# Patient Record
Sex: Female | Born: 1985 | Race: White | Hispanic: No | Marital: Single
Health system: Southern US, Community
[De-identification: ages and names within clinical notes are randomized; demographics above are authoritative.]

---

## 2013-01-13 ENCOUNTER — Other Ambulatory Visit: Payer: Self-pay

## 2013-01-14 ENCOUNTER — Other Ambulatory Visit (HOSPITAL_COMMUNITY): Payer: Self-pay | Admitting: Unknown Physician Specialty

## 2013-01-14 DIAGNOSIS — O280 Abnormal hematological finding on antenatal screening of mother: Secondary | ICD-10-CM

## 2013-01-27 ENCOUNTER — Encounter (HOSPITAL_COMMUNITY): Payer: Self-pay

## 2013-01-27 ENCOUNTER — Ambulatory Visit (HOSPITAL_COMMUNITY)
Admission: RE | Admit: 2013-01-27 | Discharge: 2013-01-27 | Disposition: A | Discharge status: Home / Self Care | Payer: Medicaid - Out of State | Source: Ambulatory Visit | Attending: Unknown Physician Specialty | Admitting: Unknown Physician Specialty

## 2013-01-27 ENCOUNTER — Other Ambulatory Visit (HOSPITAL_COMMUNITY): Payer: Self-pay | Admitting: Unknown Physician Specialty

## 2013-01-27 VITALS — BP 134/78 | HR 108 | Wt 246.0 lb

## 2013-01-27 DIAGNOSIS — O280 Abnormal hematological finding on antenatal screening of mother: Secondary | ICD-10-CM

## 2013-01-27 DIAGNOSIS — O3500X0 Maternal care for (suspected) central nervous system malformation or damage in fetus, unspecified, not applicable or unspecified: Secondary | ICD-10-CM | POA: Insufficient documentation

## 2013-01-27 DIAGNOSIS — O99210 Obesity complicating pregnancy, unspecified trimester: Secondary | ICD-10-CM

## 2013-01-27 DIAGNOSIS — E669 Obesity, unspecified: Secondary | ICD-10-CM | POA: Insufficient documentation

## 2013-01-27 DIAGNOSIS — Z1389 Encounter for screening for other disorder: Secondary | ICD-10-CM | POA: Insufficient documentation

## 2013-01-27 DIAGNOSIS — O358XX0 Maternal care for other (suspected) fetal abnormality and damage, not applicable or unspecified: Secondary | ICD-10-CM | POA: Insufficient documentation

## 2013-01-27 DIAGNOSIS — O289 Unspecified abnormal findings on antenatal screening of mother: Secondary | ICD-10-CM

## 2013-01-27 DIAGNOSIS — Z363 Encounter for antenatal screening for malformations: Secondary | ICD-10-CM | POA: Insufficient documentation

## 2013-01-27 DIAGNOSIS — O350XX0 Maternal care for (suspected) central nervous system malformation in fetus, not applicable or unspecified: Secondary | ICD-10-CM | POA: Insufficient documentation

## 2013-01-27 IMAGING — US US OB DETAIL+14 WK
1 series · 12 of 28 positions shown · non-contrast
Comparison: none

[Series 1: us ob detail+14 wk · 0.20mm/px · 12 of 103 slices shown]
[im 4/103]
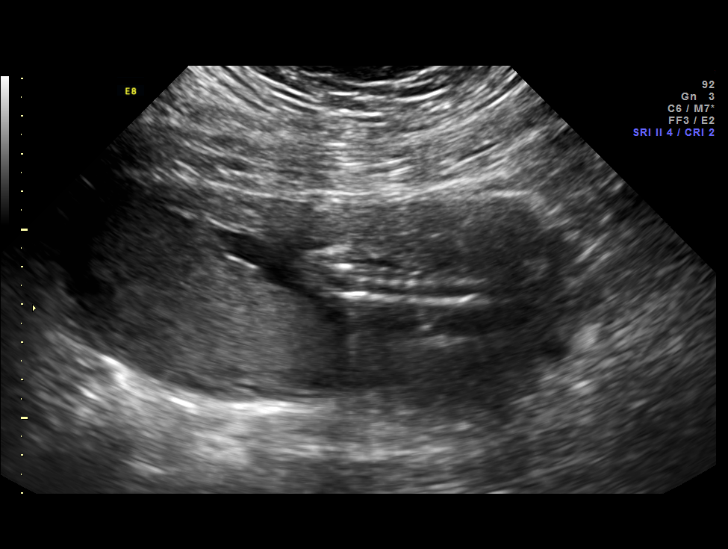
[im 12/103]
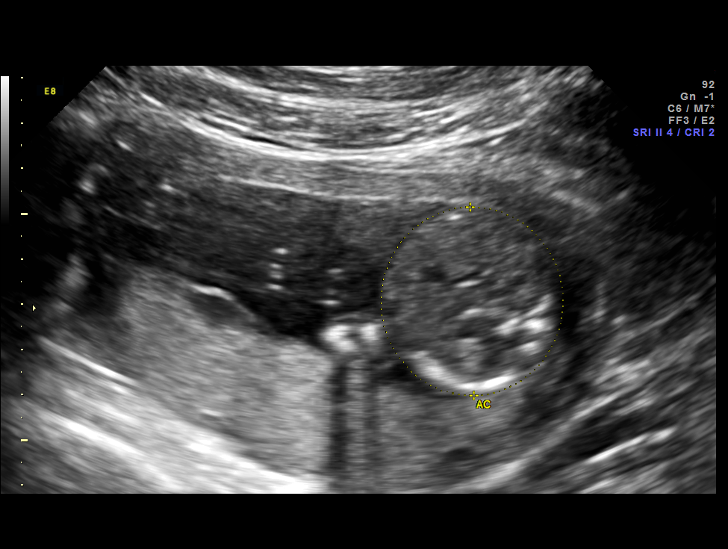
[im 19/103]
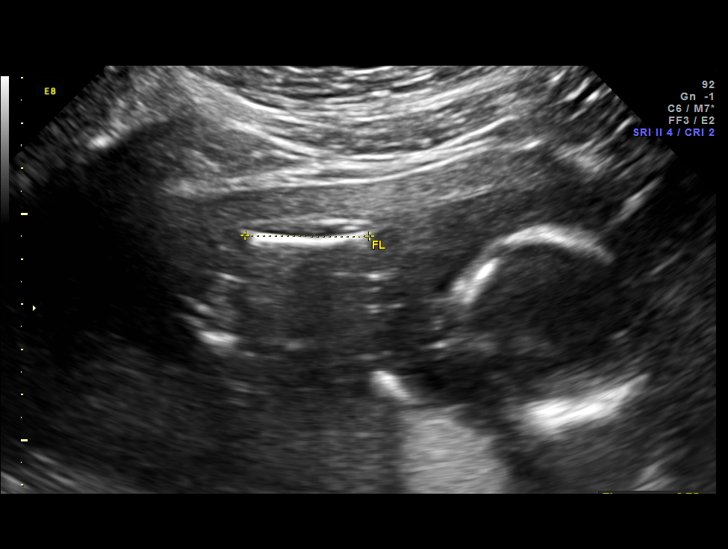
[im 31/103]
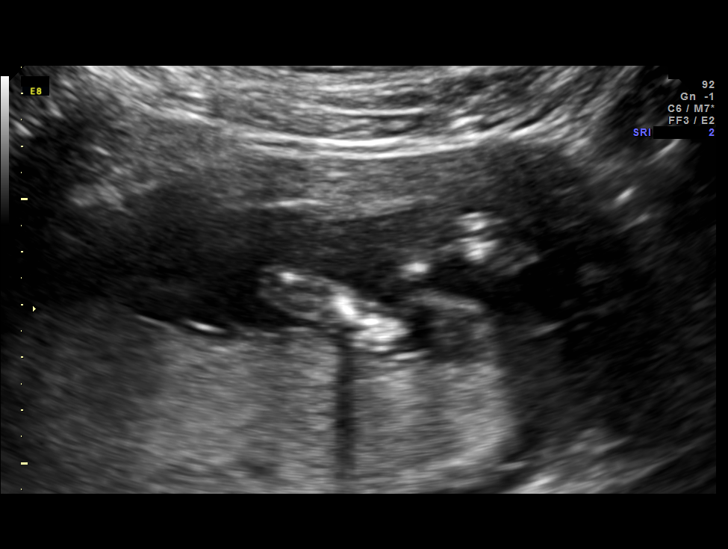
[im 38/103]
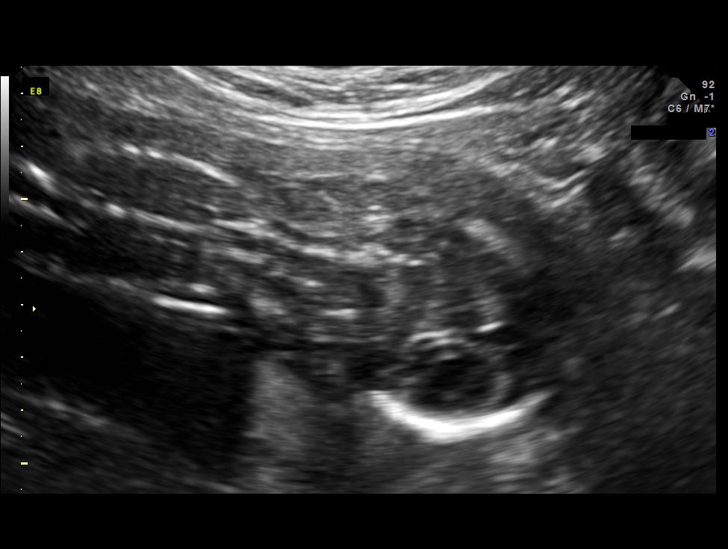
[im 46/103]
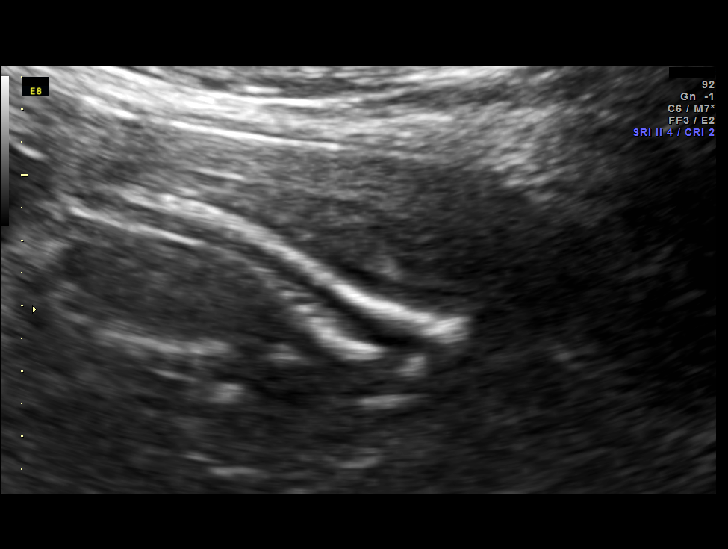
[im 57/103]
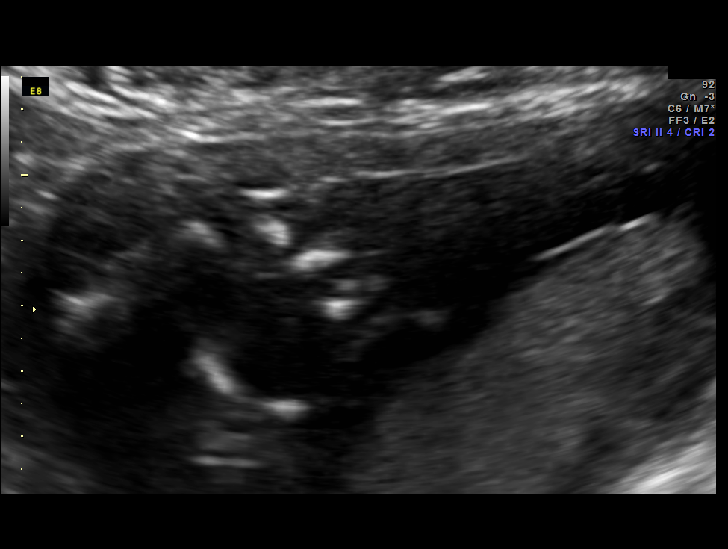
[im 65/103]
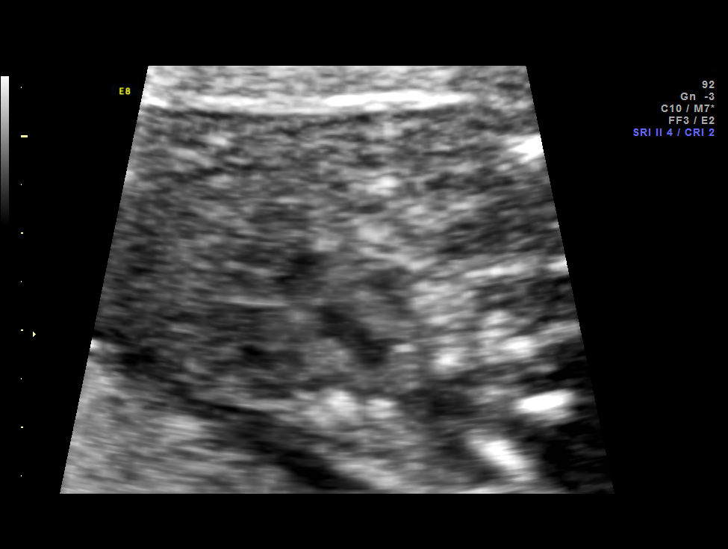
[im 72/103]
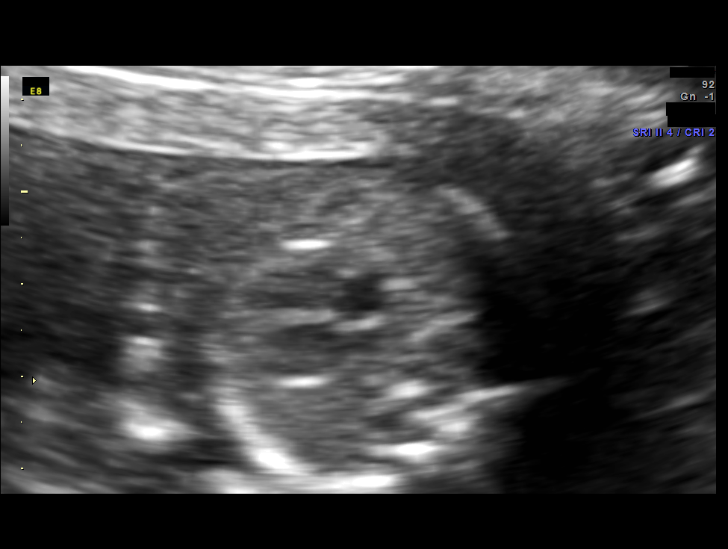
[im 84/103]
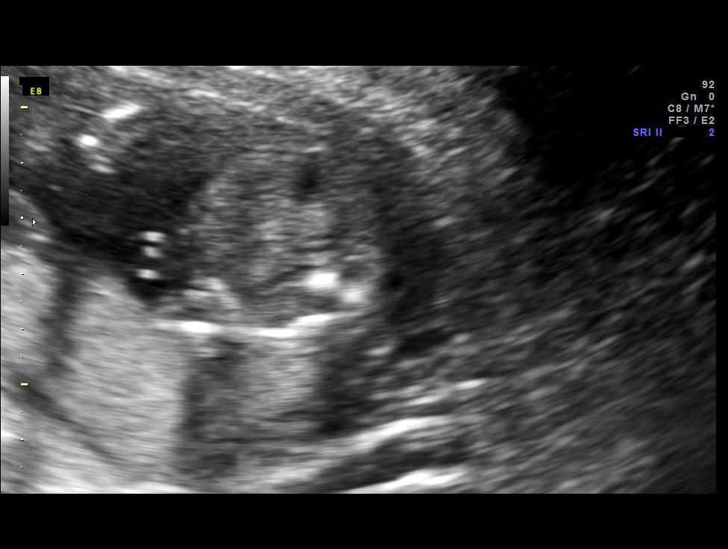
[im 91/103]
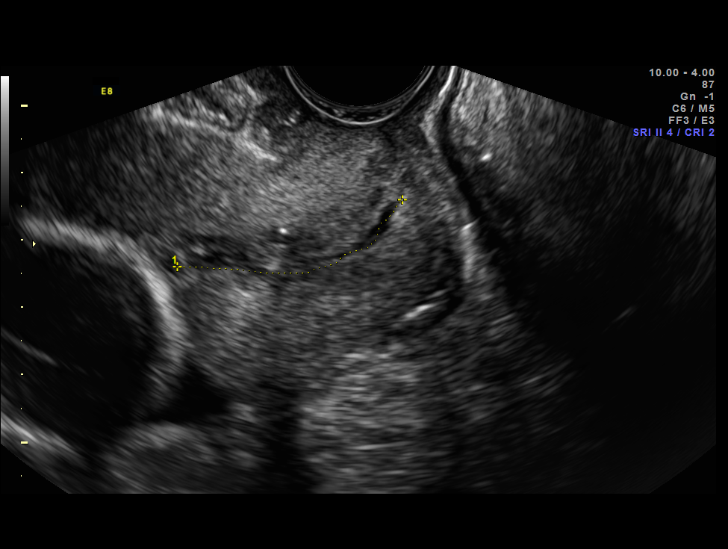
[im 99/103]
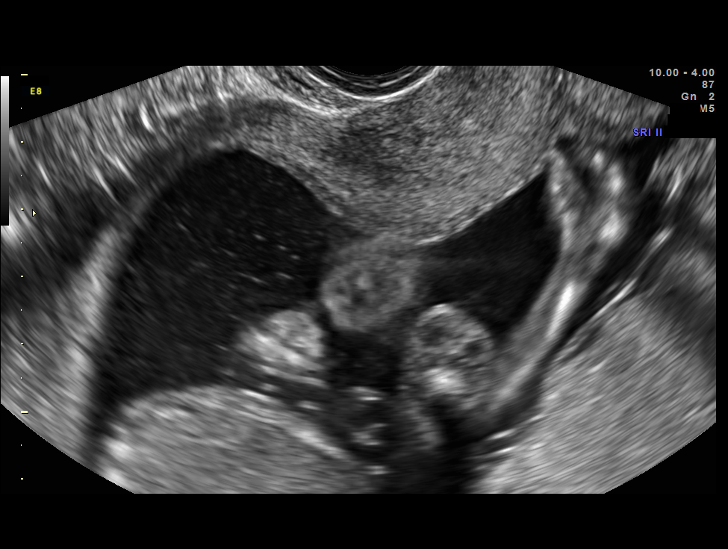

[12 of 28 positions shown; findings below may reference images not displayed]

OBSTETRICS REPORT
                      (Signed Final [DATE] [DATE])

Service(s) Provided

 US OB DETAIL + 14 WK                                  76811.0
Indications

 Elevated MSAFP  [DATE] risk of ONTD
 Detailed fetal anatomic survey                        655.83 [2X]
 Maternal morbid obesity
Fetal Evaluation

 Num Of Fetuses:    1
 Fetal Heart Rate:  147                          bpm
 Cardiac Activity:  Observed
 Presentation:      Cephalic
 Placenta:          Posterior, above cervical
                    os
 P. Cord            Visualized
 Insertion:

 Amniotic Fluid
 AFI FV:      Subjectively within normal limits
                                             Larg Pckt:     3.3  cm
Biometry

 BPD:     39.6  mm     G. Age:  18w 0d                CI:         70.7   70 - 86
 OFD:       56  mm                                    FL/HC:      18.0   16.1 -

 HC:     155.4  mm     G. Age:  18w 3d       38  %    HC/AC:      1.22   1.09 -

 AC:     127.7  mm     G. Age:  18w 3d       39  %    FL/BPD:
 FL:      27.9  mm     G. Age:  18w 4d       43  %    FL/AC:      21.8   20 - 24
 HUM:     27.1  mm     G. Age:  18w 4d       54  %
 CER:       20  mm     G. Age:  19w 0d       66  %

 Est. FW:     239  gm      0 lb 8 oz     43  %
Gestational Age

 LMP:           18w 4d        Date:  [DATE]                 EDD:   [DATE]
 U/S Today:     18w 3d                                        EDD:   [DATE]
 Best:          18w 4d     Det. By:  LMP  ([DATE])          EDD:   [DATE]
Anatomy
 Cranium:          Appears normal         Aortic Arch:      Not well visualized
 Fetal Cavum:      Appears normal         Ductal Arch:      Not well visualized
 Ventricles:       Appears normal         Diaphragm:        Appears normal
 Choroid Plexus:   Appears normal         Stomach:          Appears normal
 Cerebellum:       Appears normal         Abdomen:          Appears normal
 Posterior Fossa:  Appears normal         Abdominal Wall:   Appears nml (cord
                                                            insert, abd wall)
 Nuchal Fold:      Not well visualized    Cord Vessels:     2 vessel cord,
                                                            absent AUNTYJATTY
 Face:             Appears normal         Kidneys:          Appear normal
                   (orbits and profile)
 Lips:             Appears normal         Bladder:          Appears normal
 Heart:            Not well visualized    Spine:            Appears normal
 RVOT:             Not well visualized    Lower             Appears normal
                                          Extremities:
 LVOT:             Not well visualized    Upper             Appears normal
                                          Extremities:

 Other:  Nasal bone visualized. Heels and 5th digit visualized. Male gender.
         Technically difficult due to maternal habitus and fetal position.
Targeted Anatomy

 Fetal Central Nervous System
 Cisterna Magna:
Cervix Uterus Adnexa

 Cervical Length:    3.8      cm

 Cervix:       Measured transvaginally.
 Left Ovary:    Within normal limits.
 Right Ovary:   Within normal limits.
Impression

 Single IUP at 18 [DATE] weeks
 Elevated MSAFP ([DATE] ONTD risk)
 Somewhat limited views of the fetal heart obtained due to
 maternal body habitus
 The spine, posterior fossa and cerebellum appear normal
 (transvaginal ultrasound).
 A single umbilical artery was detected.
 Normal amniotic fluid volume

 Findings and limitations of the study were discussed with the
 patient.  Ultrasound alone has a detection rate of > 90% for
 open neural tube defects.  See separate note from Genetics
 counselor.  With the exception of the single umbilical artery,
 no anomalies were noted, but the patient remains at some
 increased risk for fetal growth restriction and preeclampsia.
Recommendations

 Recommend follow up in 4 weeks to reevaluate fetal anatomy
 and interval growth.
 Fetal echo at 
 22 weeks due to single umbilical artery.

## 2013-01-27 NOTE — Progress Notes (Signed)
Genetic Counseling  High-Risk Gestation Note  Appointment Date:  01/27/2013 Referred By: Ernestina Penna, MD Date of Birth:  04-16-1985 Partner: Cristopher Peru   Pregnancy History: G1P0 Estimated Date of Delivery: 06/26/13 Estimated Gestational Age: [redacted]w[redacted]d Attending: Alpha Gula   Ms. TODD ARGABRIGHT was seen for consultation for genetic counseling because of an elevated MSAFP of 2.93 MoMs based on maternal serum screening through [facility].  The father of the baby's mother also attended the genetic counseling appointment today.  We reviewed Ms. Outten maternal serum screening result, the elevation of MSAFP, and the associated 1 in 132 risk for a fetal open neural tube defect.   We reviewed ONTDs, the typical multifactorial etiology, and variable prognosis.  In addition, we discussed additional explanations for an elevated MSAFP including: normal variation, twins, feto-maternal bleeding, a gestational dating error, abdominal wall defects, kidney differences, oligohydramnios, and placental problems.  We discussed that an unexplained elevation of MSAFP is associated with an increased risk for third trimester complications including: prematurity, low birth weight, and pre-eclampsia.    We reviewed additional available screening and diagnostic options including detailed ultrasound and amniocentesis.  We discussed the risks, limitations, and benefits of each.  After thoughtful consideration of these options, Ms. Terry elected to have ultrasound, but declined amniocentesis.  She understands that ultrasound cannot rule out all birth defects or genetic syndromes.  However, she was counseled that 80-90% of fetuses with ONTDs can be detected by detailed 2nd trimester ultrasound, when well visualized.  A complete ultrasound was performed today. Ultrasound revealed a single umbilical artery (SUA).  Ms. Dome was counseled that SUA occurs in approximately 1 in every 200 pregnancies and is defined as the  presence of one umbilical artery and one umbilical vein, instead of the typical three vessel cord containing two arteries and one vein.  We discussed that the finding of an isolated SUA (no other markers or anomalies visualized) is not thought to increase the risk for fetal aneuploidy. However, the literature is conflicting regarding an association between this finding and congenital heart defects.  In addition, we discussed that SUA is known to be associated with an increased risk for fetal growth restriction.  We reviewed the options of serial ultrasound to monitor fetal growth and a fetal echocardiogram.  The fetal anatomy was not well visualized due to maternal body habitus and fetal positioning.  The ultrasound report will be documented separately.  Ms. Copus was provided with written information regarding cystic fibrosis (CF) including the carrier frequency and incidence in the Caucasian population, the availability of carrier testing and prenatal diagnosis if indicated.  In addition, we discussed that CF is routinely screened for as part of the [place] newborn screening panel.  She declined testing today.   Both family histories were reviewed and found to be noncontributory for birth defects, intellectual disability, and known genetic conditions.  Without further information regarding the provided family history, an accurate genetic risk cannot be calculated. Further genetic counseling is warranted if more information is obtained.  Ms. Kimmey denied exposure to environmental toxins or chemical agents. She denied the use of alcohol and street drugs. She reported that she smokes 1 ppd.  We reviewed the implications and counseled regarding cessation.  She denied significant viral illnesses during the course of her pregnancy. Her medical and surgical histories were contributory for obesity and CHTN.   I counseled Ms. Zingale for approximately 40 minutes regarding the above risks and available options.     Despina Arias, MS  Patent attorneyCertified Genetic Counselor

## 2013-01-27 NOTE — Progress Notes (Signed)
Tonya Gutierrez  was seen today for an ultrasound appointment.  See full report in [facility].  Impression: Single IUP at 18 4/7 weeks Elevated MSAFP (1:132 ONTD risk) Somewhat limited views of the fetal heart obtained due to maternal body habitus The spine, posterior fossa and cerebellum appear normal (transvaginal ultrasound).   A single umbilical artery was detected. Normal amniotic fluid volume  Findings and limitations of the study were discussed with the patient.  Ultrasound alone has a detection rate of > 90% for open neural tube defects.  See separate note from Runner, broadcasting/film/videoGenetics counselor.  With the exception of the single umbilical artery, no anomalies were noted, but the patient remains at some increased risk for fetal growth restriction and preeclampsia.  Recommendations: Recommend follow up in 4 weeks to reevaluate fetal anatomy and interval growth. Fetal echo at ~ 22 weeks due to single umbilical artery.  Tonya GulaPaul Evamae Rowen, MD

## 2013-02-18 ENCOUNTER — Ambulatory Visit (HOSPITAL_COMMUNITY)
Admission: RE | Admit: 2013-02-18 | Discharge: 2013-02-18 | Disposition: A | Discharge status: Home / Self Care | Payer: Medicaid - Out of State | Source: Ambulatory Visit | Attending: Unknown Physician Specialty | Admitting: Unknown Physician Specialty

## 2013-02-18 VITALS — BP 134/74 | HR 108 | Wt 246.0 lb

## 2013-02-18 DIAGNOSIS — O3500X0 Maternal care for (suspected) central nervous system malformation or damage in fetus, unspecified, not applicable or unspecified: Secondary | ICD-10-CM | POA: Insufficient documentation

## 2013-02-18 DIAGNOSIS — E669 Obesity, unspecified: Secondary | ICD-10-CM | POA: Insufficient documentation

## 2013-02-18 DIAGNOSIS — O350XX0 Maternal care for (suspected) central nervous system malformation in fetus, not applicable or unspecified: Secondary | ICD-10-CM | POA: Insufficient documentation

## 2013-02-18 DIAGNOSIS — O280 Abnormal hematological finding on antenatal screening of mother: Secondary | ICD-10-CM

## 2013-02-18 DIAGNOSIS — O99210 Obesity complicating pregnancy, unspecified trimester: Secondary | ICD-10-CM

## 2013-02-18 DIAGNOSIS — O289 Unspecified abnormal findings on antenatal screening of mother: Secondary | ICD-10-CM

## 2013-04-01 ENCOUNTER — Ambulatory Visit (HOSPITAL_COMMUNITY): Payer: Medicaid - Out of State

## 2013-04-01 ENCOUNTER — Ambulatory Visit (HOSPITAL_COMMUNITY): Payer: Medicaid - Out of State | Attending: Unknown Physician Specialty

## 2013-11-08 ENCOUNTER — Encounter (HOSPITAL_COMMUNITY): Payer: Self-pay

## 2013-12-02 ENCOUNTER — Encounter (HOSPITAL_COMMUNITY): Payer: Self-pay | Admitting: *Deleted
